# Patient Record
Sex: Female | Born: 1985
Health system: Southern US, Community
[De-identification: ages and names within clinical notes are randomized; demographics above are authoritative.]

## PROBLEM LIST (undated history)

## (undated) HISTORY — PX: HERNIA REPAIR: SHX51

---

## 2014-11-05 ENCOUNTER — Emergency Department (HOSPITAL_BASED_OUTPATIENT_CLINIC_OR_DEPARTMENT_OTHER)
Admission: EM | Admit: 2014-11-05 | Discharge: 2014-11-05 | Disposition: A | Discharge status: Home / Self Care | Payer: Self-pay | Attending: Emergency Medicine | Admitting: Emergency Medicine

## 2014-11-05 ENCOUNTER — Encounter (HOSPITAL_BASED_OUTPATIENT_CLINIC_OR_DEPARTMENT_OTHER): Payer: Self-pay | Admitting: Emergency Medicine

## 2014-11-05 DIAGNOSIS — K0889 Other specified disorders of teeth and supporting structures: Secondary | ICD-10-CM

## 2014-11-05 DIAGNOSIS — K088 Other specified disorders of teeth and supporting structures: Secondary | ICD-10-CM | POA: Insufficient documentation

## 2014-11-05 DIAGNOSIS — K029 Dental caries, unspecified: Secondary | ICD-10-CM | POA: Insufficient documentation

## 2014-11-05 MED ORDER — PENICILLIN V POTASSIUM 500 MG PO TABS: 500.0000 mg | ORAL_TABLET | Freq: Three times a day (TID) | ORAL | Status: DC

## 2014-11-05 NOTE — ED Notes (Signed)
States onset over 1 year.  One lower molar fell out(rt)  And has a lower left tooth broken in half

## 2014-11-05 NOTE — ED Provider Notes (Signed)
CSN: [id number]     Arrival date & time 11/05/14  1004 History   First MD Initiated Contact with Patient 11/05/14 1039     Chief Complaint  Patient presents with  . Dental Problem     (Consider location/radiation/quality/duration/timing/severity/associated sxs/prior Treatment) Patient is a 29 y.o. female presenting with tooth pain. The history is provided by the patient.  Dental Pain Location:  Lower Lower teeth location:  31/RL 2nd molar Quality:  Throbbing Severity:  Moderate Onset quality:  Gradual Duration:  1 year Timing:  Constant Progression:  Worsening Chronicity:  New   History reviewed. No pertinent past medical history. History reviewed. No pertinent past surgical history. No family history on file. Social History  Substance Use Topics  . Smoking status: Never Smoker   . Smokeless tobacco: None  . Alcohol Use: None   OB History    No data available     Review of Systems  All other systems reviewed and are negative.     Allergies  Mango butter  Home Medications   Prior to Admission medications   Not on File   BP 114/77 mmHg  Pulse 73  Temp(Src) 98.5 F (36.9 C) (Oral)  Resp 18  Ht  (1.6 m)  Wt 156 lb (70.761 kg)  BMI 27.64 kg/m2  SpO2 100% Physical Exam  Constitutional: She is oriented to person, place, and time. She appears well-developed and well-nourished. No distress.  HENT:  Head: Normocephalic and atraumatic.  The right lower second molar is heavily decayed with surrounding gingival inflammation. There is no obvious abscess. There is no trismus or crepitus of the submental space.  Neck: Normal range of motion. Neck supple.  Pulmonary/Chest: No stridor.  Neurological: She is alert and oriented to person, place, and time.  Skin: Skin is warm and dry. She is not diaphoretic.  Nursing note and vitals reviewed.   ED Course  Procedures (including critical care time) Labs Review Labs Reviewed - No data to display  Imaging  Review No results found. I have personally reviewed and evaluated these images and lab results as part of my medical decision-making.   EKG Interpretation None      MDM   Final diagnoses:  None    We'll treat with antibiotics and follow-up with dentistry.    Geoffery Lyons, MD 11/05/14 1044

## 2014-11-05 NOTE — Discharge Instructions (Signed)
Penicillin as prescribed.  All up with dentistry in the next 2-3 days.   Dental Pain A tooth ache may be caused by cavities (tooth decay). Cavities expose the nerve of the tooth to air and hot or cold temperatures. It may come from an infection or abscess (also called a boil or furuncle) around your tooth. It is also often caused by dental caries (tooth decay). This causes the pain you are having. DIAGNOSIS  Your caregiver can diagnose this problem by exam. TREATMENT   If caused by an infection, it may be treated with medications which kill germs (antibiotics) and pain medications as prescribed by your caregiver. Take medications as directed.  Only take over-the-counter or prescription medicines for pain, discomfort, or fever as directed by your caregiver.  Whether the tooth ache today is caused by infection or dental disease, you should see your dentist as soon as possible for further care. SEEK MEDICAL CARE IF: The exam and treatment you received today has been provided on an emergency basis only. This is not a substitute for complete medical or dental care. If your problem worsens or new problems (symptoms) appear, and you are unable to meet with your dentist, call or return to this location. SEEK IMMEDIATE MEDICAL CARE IF:   You have a fever.  You develop redness and swelling of your face, jaw, or neck.  You are unable to open your mouth.  You have severe pain uncontrolled by pain medicine. MAKE SURE YOU:   Understand these instructions.  Will watch your condition.  Will get help right away if you are not doing well or get worse. Document Released: 02/22/2005 Document Revised: 05/17/2011 Document Reviewed: 10/11/2007 [facility] Patient Information 2015 [place], [place]. This information is not intended to replace advice given to you by your health care provider. Make sure you discuss any questions you have with your health care provider.   Emergency Department Resource  Guide 1) Find a Doctor and Pay Out of Pocket Although you won't have to find out who is covered by your insurance plan, it is a good idea to ask around and get recommendations. You will then need to call the office and see if the doctor you have chosen will accept you as a new patient and what types of options they offer for patients who are self-pay. Some doctors offer discounts or will set up payment plans for their patients who do not have insurance, but you will need to ask so you aren't surprised when you get to your appointment.  2) Contact Your Local Health Department Not all health departments have doctors that can see patients for sick visits, but many do, so it is worth a call to see if yours does. If you don't know where your local health department is, you can check in your phone book. The CDC also has a tool to help you locate your state's health department, and many state websites also have listings of all of their local health departments.  3) Find a Walk-in Clinic If your illness is not likely to be very severe or complicated, you may want to try a walk in clinic. These are popping up all over the country in pharmacies, drugstores, and shopping centers. They're usually staffed by nurse practitioners or physician assistants that have been trained to treat common illnesses and complaints. They're usually fairly quick and inexpensive. However, if you have serious medical issues or chronic medical problems, these are probably not your best option.  No Primary Care  Doctor: - Call Health Connect at  [phone] - they can help you locate a primary care doctor that  accepts your insurance, provides certain services, etc. - Physician Referral Service- [phone]  Chronic Pain Problems: Organization         Address  Phone   Notes  [facility]  [phone] Patients need to be referred by their primary care doctor.   Medication Assistance: Organization          Address  Phone   Notes  [facility] Medication [facility] [address] [place] [place], [place] [address] [phone] --Must be a resident of [facility] -- Must have NO insurance coverage whatsoever (no Medicaid/ Medicare, etc.) -- The pt. MUST have a primary care doctor that directs their care regularly and follows them in the community   [facility]  [phone]   [employer]  [phone]    Agencies that provide inexpensive medical care: Organization         Address  Phone   Notes  [facility]  [phone]   [facility]    [phone]   [facility] [address] [place], [place] [address] [phone]   [facility] [address], [place] [phone]   [facility]anned [facility]    [phone]   [facility]    [phone]   [facility]  [address], [place] Phone:  [phone], Fax:  [phone] Hours of Operation:  9 am - 6 pm, M-F.  Also accepts Medicaid/Medicare and self-pay.  [facility]  [address], [place] Phone: [phone], Fax: [phone]. Hours of Operation:  8:30 am - 5:30 pm, M-F.  Also accepts Medicaid and self-pay.  [facility] [place] [address], [place] Phone: [phone]   [facility] [address] Natasha Bence [place], [place] [phone], Ext. [phone] Mondays & Thursdays: 7-9 AM.  First 15 patients are seen on a first come, first serve basis.    Medicaid-accepting [facility] Providers:  Organization         Address  Phone   Notes  [facility] [address], [place] [phone] Also accepts self-pay patients.  [facility] [address] Laurell Josephs [place], [place]  [phone]   [facility] [address], [place] [phone]   [facility] [address] [place] [phone]   Renaye Rakers [address], [place]   [phone] Only accepts [place] Access [place] patients after they have their name applied to their card.   Self-Pay (no insurance) in [facility]:  Organization         Address  Phone   Notes  Sickle Cell Patients, [facility] [address] [place], [place] [phone]   [facility] [address] [place], [place] [phone]   [facility] [place]  [address],  [phone]   [facility]/Dr. Osei-Bonsu  [address], [place] or [address], [place] [phone] Phone number for both [place] and [place] locations is the same.  [facility] [address], [place] [phone]   [facility] [address], [place] or [address] [phone]   [facility] [address], [place] [phone], phone; [phone], fax Sees patients 1st and 3rd Saturday of every month.  Must not qualify for public or private insurance (i.e. Medicaid, Medicare, [place] Health Choice, Veterans' Benefits)  Household income should be no more than 200% of the poverty level The clinic cannot treat you if you are pregnant or think you are pregnant  Sexually transmitted diseases are not treated at the clinic.    Dental Care: Organization         Address  Phone  Notes  [facility] Department of [facility] [address] [place], [place] [phone] Accepts children up to age 60 who are enrolled in [place] or [place] Health Choice; pregnant women with a Medicaid card; and children who have applied for Medicaid or [place] Health Choice, but were declined, whose parents can pay a reduced fee at time of service.  [facility] Department of [facility]  [address], [place] [phone] Accepts children up to age 33 who are enrolled in [place] or [place] Health Choice; pregnant women with a Medicaid card; and children who have applied for Medicaid or [place] Health Choice, but were declined, whose parents can pay a reduced fee at time of service.  [facility]  [address] [place], [place] [phone] Patients are seen by appointment only. Walk-ins are not accepted. [facility] will see patients 23 years of age and older. Monday - Tuesday (8am-5pm) Most Wednesdays (8:30-5pm) $30 per visit, cash only  [facility] Adult Dental Access PRO[facility]  [address], [facility] [phone] Patients are seen by appointment only. Walk-ins are not accepted. [facility] will see patients 43 years of age and older. One Wednesday Evening (Monthly: Volunteer Based).  $30 per visit, cash only  [employer]  [phone] for adults; Children under age 24, call Graduate Pediatric Dentistry at [phone]. Children aged 58-14, please call [phone] to request a pediatric application.  Dental services are provided in all areas of dental care including fillings, crowns and bridges, complete and partial dentures, implants, gum treatment, root canals, and extractions. Preventive care is also provided. Treatment is provided to both adults and children. Patients are selected via a lottery and there is often a waiting list.   [facility] [address] [place]  [phone] [email or web address]   [facility] [address] [place], [place] [phone], Ext. [phone] Second and Fourth Thursday of each month, opens at 6:30 AM; Clinic ends at 9 AM.  Patients are seen on a first-come first-served basis, and a limited number are seen during each clinic.   [facility]  [address] Ether Griffins [place], [place] [phone]   Eligibility Requirements You must  have lived in [place], [place], or [place] counties for at least the last three months.   You cannot be eligible for state or federal sponsored [place], including [employer], [place], or [employer].   You generally cannot be eligible for healthcare insurance through your employer.    How to apply: Eligibility screenings are held every Tuesday and Wednesday afternoon from 1:00 pm until 4:00 pm. You do not need an appointment for the interview!  [facility] [address] [place], [place] [phone]   Aaron Edelman  [employer] Department  [phone]   [facility] Health Department  [phone]   [facility] Health Department  [phone]    Behavioral Health Resources in the Community: Intensive Outpatient Programs Organization         Address  Phone  Notes  [facility] [place] [address] [place], [place] [phone]   [facility] [address] [place], [place] [phone]   [facility] [address] [place], [place]  [phone]   [facility] [address]  [place], [place] [phone] or [phone]   Substance Abuse Resources Organization         Address  Phone  Notes  [facility] Drug Services  [phone]   [facility]  [phone]   [facility] [place]  [phone]   Floydene Flock  [phone]   Residential & Outpatient Substance Abuse Program  [phone]   Psychological Services Organization         Address  Phone  Notes  [facility] Behavioral Health  [phone]   [facility]  [phone]   [facility] Mental Health [address] [place] [phone] or [phone]    Mobile Crisis Teams Organization         Address  Phone  Notes  [facility], Mobile Crisis Care Unit  [phone]   [facility]  [address] [place], [place] [phone]   Doristine Locks [address] [place] [phone]    Self-Help/Support Groups Organization         Address  Phone             Notes  [facility] [place] - variety of support groups  336- I7437963 Call for more information  Narcotics Anonymous (NA), [facility] [address], [employer] [place]  2 meetings at this location   Statistician         Address  Phone  Notes  [facility] [address],    [place]  [phone]   [facility]  [address] [place] [address], [place], [place] [phone]   [facility] [address] [place], [place] [phone] Admissions: 8am-3pm M-F  [facility] [address]    [place], [place] [phone]   [facility] [address] [place], [place], [place] [phone]   [facility] [address],  [place], [place] [phone]   [facility] - Intensive Outpatient [address] Laurell Joseph[address], [place], [place] [phone]   [facility] ([facility]iction [facility] [address] [place].,  [place], [place] [phone] or [phone]   [facility] ([facility]) [address], [place], [place] [phone] Accepts Medicaid  [facility] [address],  [place] [phone] Substance Abuse/Addiction Treatment   [facility] Organization         Address  Phone  Notes  [facility]  [phone]   Angie Fava, PhD [address] Ervin Knack [place], [place]   [phone] or [phone]   [facility]   [address] [place], [place] [phone]   [facility] [address] [place], [place] [phone] Insurance/Medicaid/sponsorship through [employer] and Families [address]  [place], [place] [phone] [place], [place] [phone]    Dr. Adele Schilder  [phone]   [facility] Dept. 1) [address], [place] 2) [place] 3)  [place] [address], [place] [phone]  [phone]   [place] [phone] or [phone] (After Hours)

## 2014-12-14 ENCOUNTER — Emergency Department (HOSPITAL_BASED_OUTPATIENT_CLINIC_OR_DEPARTMENT_OTHER)
Admission: EM | Admit: 2014-12-14 | Discharge: 2014-12-14 | Disposition: A | Discharge status: Home / Self Care | Payer: Self-pay | Attending: Emergency Medicine | Admitting: Emergency Medicine

## 2014-12-14 ENCOUNTER — Encounter (HOSPITAL_BASED_OUTPATIENT_CLINIC_OR_DEPARTMENT_OTHER): Payer: Self-pay | Admitting: Emergency Medicine

## 2014-12-14 ENCOUNTER — Emergency Department (HOSPITAL_BASED_OUTPATIENT_CLINIC_OR_DEPARTMENT_OTHER): Payer: Self-pay

## 2014-12-14 DIAGNOSIS — J029 Acute pharyngitis, unspecified: Secondary | ICD-10-CM | POA: Insufficient documentation

## 2014-12-14 DIAGNOSIS — Z79899 Other long term (current) drug therapy: Secondary | ICD-10-CM | POA: Insufficient documentation

## 2014-12-14 LAB — RAPID STREP SCREEN (MED CTR MEBANE ONLY): Streptococcus, Group A Screen (Direct): NEGATIVE

## 2014-12-14 MED ORDER — DEXAMETHASONE 4 MG PO TABS
10.0000 mg | ORAL_TABLET | Freq: Once | ORAL | Status: AC
  Administered 2014-12-14: 10 mg via ORAL

## 2014-12-14 MED ORDER — DEXAMETHASONE 4 MG PO TABS
ORAL_TABLET | ORAL | Status: AC
  Filled 2014-12-14: qty 3

## 2014-12-14 MED ORDER — BENZONATATE 100 MG PO CAPS: 100.0000 mg | ORAL_CAPSULE | Freq: Three times a day (TID) | ORAL | Status: DC

## 2014-12-14 NOTE — ED Notes (Signed)
Pt states has been sick "off and on" for about 2 weeks with cold like symptoms and sore throat.

## 2014-12-14 NOTE — Discharge Instructions (Signed)

## 2014-12-14 NOTE — ED Provider Notes (Signed)
CSN: [id number]     Arrival date & time 12/14/14  4098 History   First MD Initiated Contact with Patient 12/14/14 0820     Chief Complaint  Patient presents with  . Sore Throat     (Consider location/radiation/quality/duration/timing/severity/associated sxs/prior Treatment) Patient is a 29 y.o. female presenting with cough.  Cough Cough characteristics:  Non-productive Severity:  Moderate Onset quality:  Gradual Duration:  2 weeks Timing:  Intermittent Progression:  Unchanged Chronicity:  New Smoker: no   Context: upper respiratory infection   Relieved by:  Nothing Worsened by:  Nothing tried Associated symptoms: chills, rhinorrhea and sinus congestion   Associated symptoms: no fever, no rash and no shortness of breath     History reviewed. No pertinent past medical history. History reviewed. No pertinent past surgical history. No family history on file. Social History  Substance Use Topics  . Smoking status: Never Smoker   . Smokeless tobacco: None  . Alcohol Use: No   OB History    No data available     Review of Systems  Constitutional: Positive for chills. Negative for fever.  HENT: Positive for rhinorrhea.   Respiratory: Positive for cough. Negative for shortness of breath.   Skin: Negative for rash.  All other systems reviewed and are negative.     Allergies  Mango butter  Home Medications   Prior to Admission medications   Medication Sig Start Date End Date Taking? Authorizing Provider  benzonatate (TESSALON) 100 MG capsule Take 1 capsule (100 mg total) by mouth every 8 (eight) hours. 12/14/14   Mirian Mo, MD  penicillin v potassium (VEETID) 500 MG tablet Take 1 tablet (500 mg total) by mouth 3 (three) times daily. 11/05/14   Geoffery Lyons, MD   BP 113/72 mmHg  Pulse 96  Temp(Src) 98.5 F (36.9 C) (Oral)  Resp 18  Ht  (1.6 m)  Wt 156 lb (70.761 kg)  BMI 27.64 kg/m2  SpO2 99%  LMP 11/14/2014 (Approximate) Physical Exam   Constitutional: She is oriented to person, place, and time. She appears well-developed and well-nourished.  HENT:  Head: Normocephalic and atraumatic.  Right Ear: External ear normal.  Left Ear: External ear normal.  Oropharynx clear  Eyes: Conjunctivae and EOM are normal. Pupils are equal, round, and reactive to light.  Neck: Normal range of motion. Neck supple.  Cardiovascular: Normal rate, regular rhythm, normal heart sounds and intact distal pulses.   Pulmonary/Chest: Effort normal and breath sounds normal.  Abdominal: Soft. Bowel sounds are normal. There is no tenderness.  Musculoskeletal: Normal range of motion.  Neurological: She is alert and oriented to person, place, and time.  Skin: Skin is warm and dry.  Vitals reviewed.   ED Course  Procedures (including critical care time) Labs Review Labs Reviewed  RAPID STREP SCREEN (NOT AT [facility])  CULTURE, GROUP A STREP    Imaging Review Dg Chest 2 View  12/14/2014   CLINICAL DATA:  Cough and congestion.  EXAM: CHEST - 2 VIEW  COMPARISON:  None  FINDINGS: The heart size and mediastinal contours are within normal limits. Mild bronchial thickening present in both lower lung zones. There is no evidence of pulmonary edema, pneumothorax, nodule or pleural fluid. The visualized skeletal structures are unremarkable.  IMPRESSION: Bronchial thickening which may be reflective of active bronchitis. No focal infiltrate identified.   Electronically Signed   By: Irish Lack M.D.   On: 12/14/2014 09:01   I have personally reviewed and evaluated these images and  lab results as part of my medical decision-making.   EKG Interpretation None      MDM   Final diagnoses:  Pharyngitis    29 y.o. female without pertinent PMH presents with signs/symptoms of URI.  Exam benign, oropharynx clear.  Decadron given for sore throat.  DC home in stable condition.    I have reviewed all laboratory and imaging studies if ordered as above  1.  Pharyngitis         Mirian Mo, MD 12/14/14 [phone]

## 2014-12-16 LAB — CULTURE, GROUP A STREP

## 2015-05-25 ENCOUNTER — Encounter (HOSPITAL_BASED_OUTPATIENT_CLINIC_OR_DEPARTMENT_OTHER): Payer: Self-pay

## 2015-05-25 ENCOUNTER — Emergency Department (HOSPITAL_BASED_OUTPATIENT_CLINIC_OR_DEPARTMENT_OTHER)
Admission: EM | Admit: 2015-05-25 | Discharge: 2015-05-25 | Disposition: A | Discharge status: Home / Self Care | Payer: Self-pay | Attending: Emergency Medicine | Admitting: Emergency Medicine

## 2015-05-25 DIAGNOSIS — R110 Nausea: Secondary | ICD-10-CM | POA: Insufficient documentation

## 2015-05-25 DIAGNOSIS — Z792 Long term (current) use of antibiotics: Secondary | ICD-10-CM | POA: Insufficient documentation

## 2015-05-25 DIAGNOSIS — J069 Acute upper respiratory infection, unspecified: Secondary | ICD-10-CM | POA: Insufficient documentation

## 2015-05-25 MED ORDER — ONDANSETRON 4 MG PO TBDP: 4.0000 mg | ORAL_TABLET | Freq: Three times a day (TID) | ORAL | Status: DC | PRN

## 2015-05-25 MED ORDER — IBUPROFEN 800 MG PO TABS: 800.0000 mg | ORAL_TABLET | Freq: Three times a day (TID) | ORAL | Status: DC

## 2015-05-25 MED ORDER — BENZONATATE 100 MG PO CAPS: 100.0000 mg | ORAL_CAPSULE | Freq: Three times a day (TID) | ORAL | Status: DC

## 2015-05-25 NOTE — ED Notes (Signed)
Pt reports cough, cold, congestion and runny nose since Wednesday. Reports "taking everything" but not feeling any better.

## 2015-05-25 NOTE — ED Provider Notes (Signed)
CSN: [id number]     Arrival date & time 05/25/15  [id number] History   First MD Initiated Contact with Patient 05/25/15 [phone]     Chief Complaint  Patient presents with  . URI     (Consider location/radiation/quality/duration/timing/severity/associated sxs/prior Treatment) HPI   Erica Andersen is a 30 y.o. female, patient with no pertinent past medical history, presenting to the ED with cough, nasal congestion, and rhinorrhea for the last four days. Endorses some minor nausea. Pt has tried Theraflu, Delsym, and Mucinex, all with minimal relief. Pt denies fever/chills, vomiting/diarrhea, chest pain, shortness of breath, abdominal pain, or any other complaints.   History reviewed. No pertinent past medical history. History reviewed. No pertinent past surgical history. No family history on file. Social History  Substance Use Topics  . Smoking status: Never Smoker   . Smokeless tobacco: None  . Alcohol Use: No   OB History    No data available     Review of Systems  Constitutional: Negative for fever, chills and diaphoresis.  HENT: Positive for congestion and rhinorrhea.   Respiratory: Positive for cough.   Cardiovascular: Negative for chest pain.  Gastrointestinal: Positive for nausea. Negative for vomiting, abdominal pain and diarrhea.  Skin: Negative for color change and pallor.  Neurological: Negative for dizziness, light-headedness and headaches.  All other systems reviewed and are negative.     Allergies  Mango butter  Home Medications   Prior to Admission medications   Medication Sig Start Date End Date Taking? Authorizing Provider  benzonatate (TESSALON) 100 MG capsule Take 1 capsule (100 mg total) by mouth every 8 (eight) hours. 12/14/14   Mirian MoMatthew Gentry, MD  benzonatate (TESSALON) 100 MG capsule Take 1 capsule (100 mg total) by mouth every 8 (eight) hours. 05/25/15   Shawn C Joy, PA-C  ibuprofen (ADVIL,MOTRIN) 800 MG tablet Take 1 tablet (800 mg total) by mouth 3 (three)  times daily. 05/25/15   Shawn C Joy, PA-C  ondansetron (ZOFRAN ODT) 4 MG disintegrating tablet Take 1 tablet (4 mg total) by mouth every 8 (eight) hours as needed for nausea or vomiting. 05/25/15   Shawn C Joy, PA-C  penicillin v potassium (VEETID) 500 MG tablet Take 1 tablet (500 mg total) by mouth 3 (three) times daily. 11/05/14   Geoffery Lyonsouglas Delo, MD   BP 115/94 mmHg  Pulse 104  Temp(Src) 98 F (36.7 C) (Oral)  Resp 18  Ht 5\' 3"  (1.6 m)  Wt 71.215 kg  BMI 27.82 kg/m2  LMP 04/24/2015 Physical Exam  Constitutional: She appears well-developed and well-nourished. No distress.  HENT:  Head: Normocephalic and atraumatic.  Eyes: Conjunctivae are normal. Pupils are equal, round, and reactive to light.  Neck: Neck supple.  Cardiovascular: Normal rate, regular rhythm, normal heart sounds and intact distal pulses.   Pulmonary/Chest: Effort normal and breath sounds normal. No respiratory distress.  Abdominal: Soft. Bowel sounds are normal. There is no tenderness. There is no guarding.  Musculoskeletal: She exhibits no edema or tenderness.  Lymphadenopathy:    She has no cervical adenopathy.  Neurological: She is alert.  Skin: Skin is warm and dry. She is not diaphoretic.  Psychiatric: She has a normal mood and affect. Her behavior is normal.  Nursing note and vitals reviewed.   ED Course  Procedures (including critical care time)   MDM   Final diagnoses:  Upper respiratory infection    Mark Makela presents with cough, nasal congestion, and rhinorrhea for the past 4 days.  Patient's presentation is consistent  with a viral illness. Patient was not tachycardic upon my exam. No signs of sepsis. Symptomatic care and return precautions discussed. Patient voiced understanding of these instructions and is comfortable with discharge.    Anselm Pancoast, PA-C 05/25/15 0930  Pricilla Loveless, MD 05/25/15 [phone]

## 2015-05-25 NOTE — ED Notes (Signed)
PA at bedside.

## 2015-05-25 NOTE — Discharge Instructions (Signed)
You have been seen today for a cough. Your symptoms are consistent with a viral illness. Viruses do not require antibiotics. Treatment is symptomatic care. Drink plenty of fluids and get plenty of rest. You should be drinking at least a liter of water an hour to stay hydrated. Ibuprofen or Tylenol for pain or fever. Zofran for nausea. Tessalon for cough. Plain Mucinex may help relieve congestion. Warm liquids or Chloraseptic spray may help soothe the sore throat. Follow up with PCP as needed. Return to ED should symptoms worsen.  RESOURCE GUIDE  Chronic Pain Problems: Contact [facility]  [phone] Patients need to be referred by their primary care doctor.  Insufficient Money for Medicine: Contact United Way:  call "211" or [facility] [phone].  No Primary Care Doctor: - Call Health Connect  [phone] - can help you locate a primary care doctor that  accepts your insurance, provides certain services, etc. - Physician Referral Service- [phone]  Agencies that provide inexpensive medical care: - [facility]  [phone] - [facility]  [phone] - [facility]  [phone] - Women's Clinic  [phone] - Planned Parenthood  [phone] Haynes Bast- [facility]  [phone]  Medicaid-accepting [facility] Providers: - [facility]- [address], [address]  [phone], Mon-Fri 9am-7pm, Sat 9am-1pm - [facility]- [address], [address] [place]  [phone] - [facility]- [address], [address] [place]  [phone] [facility]- [facility]- [address] [place] [address]  [phone] - Renaye RakersVeita Bland- [address], [address], [phone]  Only accepts [place] patients after they have their name  applied to their card  Self Pay (no insurance) in [place]: - Sickle Cell Patients: Dr Willey BladeEric Dean, [facility]  [address],  [phone] - [facility]- [address]  [phone]       Redge Gainer-     [facility] [place]- [address], [address]       -     [facility]- see information above (Speak to CitigroupPam H if you do not have insurance)       -  [facility]- [address], [phone]       -  [facility]- [address],  [phone]       -  [facility]- [address] [place] [address], [phone]       -  Dr Julio Sickssei-Bonsu-  [address], [address], [place], [phone]       -  [facility]- [address], [phone]       -  [facility]- [address] [place] [address], [phone], also [address], [phone]       -    [facility]- [address], [phone], 1st & 3rd Saturday   every month, 10am-1pm  1) Find a Doctor and Pay Out of Pocket Although you won't have to find out who is covered by your insurance plan, it is a good idea to ask around and get recommendations. You will then need to call the office and see if the doctor you have chosen will accept you as a new patient and what types of options they offer for patients who are self-pay. Some doctors offer discounts or will set up payment plans for their patients  who do not have insurance, but you will need to ask so you aren't surprised when you get to your appointment.  2) Contact Your Local Health Department Not all health departments have doctors that can see patients for sick visits, but many do, so it is worth a call to see if yours does. If you don't know where your local health department is, you can check in your phone book. The CDC also has a tool to help you locate your state's health department, and many state websites also have listings of all of their local health departments.  3) Find a Walk-in Clinic If your illness is not likely to be very severe or complicated, you may want to try a walk in clinic. These are popping up all over the country in pharmacies,  drugstores, and shopping centers. They're usually staffed by nurse practitioners or physician assistants that have been trained to treat common illnesses and complaints. They're usually fairly quick and inexpensive. However, if you have serious medical issues or chronic medical problems, these are probably not your best option  STD Testing - [facility] of [facility] [place], STD Clinic, [address] [place], phone [phone] or [phone].  Monday - Friday, call for an appointment. [facility] of [employer], STD Clinic, [address], [place], phone [phone] or [phone].  Monday - Friday, call for an appointment.  Abuse/Neglect: [facility] Child Abuse Hotline 845-211-5865 [facility] Child Abuse Hotline (219) 792-4677 (After Hours)  Emergency Shelter:  [facility] [phone]  Maternity Homes: - [facility] [place] [facility] [phone] - [facility] [phone]  MRSA Hotline #:   343-773-1376  [facility] Resources  [facility] [place]  United [facility] Dept. [address]                 [address]         [address]  Blondell Reveal Phone:  [phone]                                  Phone:  [phone]                   Phone:  [phone]  [facility], [phone] - [facility] - [phone]       -     [facility] in [place], [address]                                  [phone], Insurance  [place] Child Abuse Hotline (315) 100-0520 or 984-593-8977 (After Hours)   Behavioral Health Services  Substance Abuse Resources: - Alcohol and Drug Services  223-185-4921 - [facility]  [phone]) [phone] - [facility]  [place] - Residential & Outpatient Substance Abuse Program  [phone]  Psychological Services: - [place], [place] [address], [place], [place]: [phone] or [phone], PicCapture.uy  Dental Assistance  If unable to pay or uninsured, contact:  [facility] or [facility]. to become qualified for the adult dental clinic.  Patients with Medicaid: [facility] [id number], [place] [address], [phone]  If unable to pay, or uninsured, contact [facility] [phone]) or [place] [phone] in [place], [place] in [facility]) to become qualified for the adult dental clinic   Other [place]- [place], [place], [place], [address], [place], [place], 2nd and 4th Thursday of the month at 6:30am.  10 clients each day by appointment, can sometimes see walk-in patients if someone does not show for an appointment. [facility]- [address] [place]ard Danker [place], [place], [address], [place], [place], [place], [address], [place] Department- [place] Department- [employer] Department- [phone]

## 2015-07-23 ENCOUNTER — Encounter (HOSPITAL_BASED_OUTPATIENT_CLINIC_OR_DEPARTMENT_OTHER): Payer: Self-pay | Admitting: *Deleted

## 2015-07-23 ENCOUNTER — Emergency Department (HOSPITAL_BASED_OUTPATIENT_CLINIC_OR_DEPARTMENT_OTHER)
Admission: EM | Admit: 2015-07-23 | Discharge: 2015-07-23 | Disposition: A | Discharge status: Home / Self Care | Payer: Self-pay | Attending: Emergency Medicine | Admitting: Emergency Medicine

## 2015-07-23 DIAGNOSIS — N912 Amenorrhea, unspecified: Secondary | ICD-10-CM | POA: Insufficient documentation

## 2015-07-23 LAB — URINALYSIS, ROUTINE W REFLEX MICROSCOPIC
Bilirubin Urine: NEGATIVE
GLUCOSE, UA: NEGATIVE mg/dL
Hgb urine dipstick: NEGATIVE
Ketones, ur: NEGATIVE mg/dL
LEUKOCYTES UA: NEGATIVE
Nitrite: NEGATIVE
PROTEIN: NEGATIVE mg/dL
Specific Gravity, Urine: 1.013 (ref 1.005–1.030)
pH: 6 (ref 5.0–8.0)

## 2015-07-23 LAB — PREGNANCY, URINE: Preg Test, Ur: NEGATIVE

## 2015-07-23 NOTE — Discharge Instructions (Signed)
Repeat a pregnancy test at home in 1 week.  Follow up with gyn if you do not start your period

## 2015-07-23 NOTE — ED Notes (Signed)
Pt states she does not remember having a period in April. Has been feeling nauseated and breasts are tender.

## 2015-07-23 NOTE — ED Notes (Signed)
Pt given d/c instructions as per chart. Verbalizes understanding. No questions. 

## 2015-07-23 NOTE — ED Notes (Signed)
PA at bedside.

## 2015-07-23 NOTE — ED Provider Notes (Signed)
CSN: [id number]     Arrival date & time 07/23/15  4098 History   First MD Initiated Contact with Patient 07/23/15 [phone]     Chief Complaint  Patient presents with  . Possible Pregnancy     (Consider location/radiation/quality/duration/timing/severity/associated sxs/prior Treatment) Patient is a 30 y.o. female presenting with pregnancy problem. The history is provided by the patient. No language interpreter was used.  Possible Pregnancy This is a new problem. The current episode started 1 to 4 weeks ago. The problem occurs constantly. The problem has been unchanged. Pertinent negatives include no abdominal pain. Nothing aggravates the symptoms. She has tried nothing for the symptoms. The treatment provided no relief.  Pt rports increased appetite, breast tenderness and no period.  Pt reports she is 2 weeks late.    History reviewed. No pertinent past medical history. History reviewed. No pertinent past surgical history. History reviewed. No pertinent family history. Social History  Substance Use Topics  . Smoking status: Never Smoker   . Smokeless tobacco: None  . Alcohol Use: No   OB History    Gravida Para Term Preterm AB TAB SAB Ectopic Multiple Living   1 0             Review of Systems  Gastrointestinal: Negative for abdominal pain.  All other systems reviewed and are negative.     Allergies  Mango butter  Home Medications   Prior to Admission medications   Medication Sig Start Date End Date Taking? Authorizing Provider  benzonatate (TESSALON) 100 MG capsule Take 1 capsule (100 mg total) by mouth every 8 (eight) hours. 12/14/14   Mirian Mo, MD  benzonatate (TESSALON) 100 MG capsule Take 1 capsule (100 mg total) by mouth every 8 (eight) hours. 05/25/15   Shawn C Joy, PA-C  ibuprofen (ADVIL,MOTRIN) 800 MG tablet Take 1 tablet (800 mg total) by mouth 3 (three) times daily. 05/25/15   Shawn C Joy, PA-C  ondansetron (ZOFRAN ODT) 4 MG disintegrating tablet Take 1 tablet  (4 mg total) by mouth every 8 (eight) hours as needed for nausea or vomiting. 05/25/15   Shawn C Joy, PA-C  penicillin v potassium (VEETID) 500 MG tablet Take 1 tablet (500 mg total) by mouth 3 (three) times daily. 11/05/14   Geoffery Lyons, MD   BP 130/97 mmHg  Pulse 76  Temp(Src) 98.8 F (37.1 C) (Oral)  Resp 20  Ht  (1.6 m)  Wt 71.215 kg  BMI 27.82 kg/m2  SpO2 100%  LMP 06/13/2015 Physical Exam  Constitutional: She is oriented to person, place, and time. She appears well-developed and well-nourished.  HENT:  Head: Normocephalic.  Right Ear: External ear normal.  Left Ear: External ear normal.  Nose: Nose normal.  Mouth/Throat: Oropharynx is clear and moist.  Eyes: EOM are normal. Pupils are equal, round, and reactive to light.  Neck: Normal range of motion.  Cardiovascular: Normal rate and normal heart sounds.   Pulmonary/Chest: Effort normal.  Abdominal: She exhibits no distension.  Musculoskeletal: Normal range of motion.  Neurological: She is alert and oriented to person, place, and time.  Psychiatric: She has a normal mood and affect.  Nursing note and vitals reviewed.   ED Course  Procedures (including critical care time) Labs Review Labs Reviewed  URINALYSIS, ROUTINE W REFLEX MICROSCOPIC (NOT AT [facility])  PREGNANCY, URINE    Imaging Review No results found. I have personally reviewed and evaluated these images and lab results as part of my medical decision-making.   EKG  Interpretation None      MDM no discharge no std concerns.   Pt advised to repeat a pregnancy test in 1 week.  Schedule with gyn if test is negative and you do not start your period   Final diagnoses:  Amenorrhea    An After Visit Summary was printed and given to the patient.    Lonia SkinnerLeslie K Forest OaksSofia, PA-C 07/23/15 1016  Marily MemosJason Mesner, MD 07/23/15 [phone]

## 2015-12-04 ENCOUNTER — Emergency Department (HOSPITAL_BASED_OUTPATIENT_CLINIC_OR_DEPARTMENT_OTHER)
Admission: EM | Admit: 2015-12-04 | Discharge: 2015-12-04 | Disposition: A | Discharge status: Home / Self Care | Payer: Self-pay | Attending: Emergency Medicine | Admitting: Emergency Medicine

## 2015-12-04 ENCOUNTER — Encounter (HOSPITAL_BASED_OUTPATIENT_CLINIC_OR_DEPARTMENT_OTHER): Payer: Self-pay | Admitting: Emergency Medicine

## 2015-12-04 DIAGNOSIS — N915 Oligomenorrhea, unspecified: Secondary | ICD-10-CM | POA: Insufficient documentation

## 2015-12-04 LAB — URINALYSIS, ROUTINE W REFLEX MICROSCOPIC
BILIRUBIN URINE: NEGATIVE
GLUCOSE, UA: NEGATIVE mg/dL
KETONES UR: NEGATIVE mg/dL
LEUKOCYTES UA: NEGATIVE
Nitrite: NEGATIVE
PH: 7.5 (ref 5.0–8.0)
PROTEIN: NEGATIVE mg/dL
Specific Gravity, Urine: 1.009 (ref 1.005–1.030)

## 2015-12-04 LAB — URINE MICROSCOPIC-ADD ON

## 2015-12-04 LAB — PREGNANCY, URINE: Preg Test, Ur: NEGATIVE

## 2015-12-04 NOTE — Discharge Instructions (Signed)
You have been seen today for multiple periods in the same month. Your lab tests showed no abnormalities. There were no signs of severe blood loss on your vital signs. Follow up with OB/GYN as soon as possible. You may take ibuprofen or naproxen for pain or discomfort.

## 2015-12-04 NOTE — ED Triage Notes (Addendum)
Pt having irregular periods since July.  Pt having two periods in July and three periods this month.  One period this month was heavier than normal.

## 2015-12-04 NOTE — ED Provider Notes (Signed)
[facility] Provider Note   CSN: [id number] Arrival date & time: 12/04/15  0946     History   Chief Complaint Chief Complaint  Patient presents with  . Dysmenorrhea    HPI Erica Andersen is a 30 y.o. female.  HPI   Erica Andersen is a 30 y.o. female, Patient with no pertinent past medical history, presenting to the ED with multiple periods this month. Pt states she is having her third period since the beginning of September. Has been using about 2-3 pads a day, which is the normal level for one of her periods. Pt had two periods in July. Normal period in August. Endorses intermittent abdominal cramping consistent with her periods. Denies N/V/C/D, fever/chills, current abdominal pain, dizziness, chest pain, shortness of breath, or any other complaints.     History reviewed. No pertinent past medical history.  There are no active problems to display for this patient.   History reviewed. No pertinent surgical history.  OB History    Gravida Para Term Preterm AB Living   1 0           SAB TAB Ectopic Multiple Live Births                   Home Medications    Prior to Admission medications   Not on File    Family History No family history on file.  Social History Social History  Substance Use Topics  . Smoking status: Never Smoker  . Smokeless tobacco: Never Used  . Alcohol use No     Allergies   Mango butter   Review of Systems Review of Systems  Constitutional: Negative for chills and fever.  Respiratory: Negative for shortness of breath.   Cardiovascular: Negative for chest pain.  Gastrointestinal: Negative for abdominal pain, nausea and vomiting.  Genitourinary: Positive for menstrual problem.  Musculoskeletal: Negative for back pain.  Neurological: Negative for dizziness, weakness, light-headedness and headaches.  All other systems reviewed and are negative.    Physical Exam Updated Vital Signs BP 118/70 (BP Location: Right Arm)    Pulse 74   Temp 98 F (36.7 C) (Oral)   Resp 18   Ht 5\' 3"  (1.6 m)   Wt 75.8 kg   LMP 12/04/2015   SpO2 100%   BMI 29.58 kg/m   Physical Exam  Constitutional: She appears well-developed and well-nourished. No distress.  HENT:  Head: Normocephalic and atraumatic.  Eyes: Conjunctivae are normal.  Neck: Neck supple.  Cardiovascular: Normal rate, regular rhythm, normal heart sounds and intact distal pulses.   Pulmonary/Chest: Effort normal and breath sounds normal. No respiratory distress.  Abdominal: Soft. There is no tenderness. There is no guarding.  Musculoskeletal: She exhibits no edema or tenderness.  Lymphadenopathy:    She has no cervical adenopathy.  Neurological: She is alert.  Skin: Skin is warm and dry. She is not diaphoretic.  Psychiatric: She has a normal mood and affect. Her behavior is normal.  Nursing note and vitals reviewed.    ED Treatments / Results  Labs (all labs ordered are listed, but only abnormal results are displayed) Labs Reviewed  URINALYSIS, ROUTINE W REFLEX MICROSCOPIC (NOT AT [facility]) - Abnormal; Notable for the following:       Result Value   Hgb urine dipstick SMALL (*)    All other components within normal limits  URINE MICROSCOPIC-ADD ON - Abnormal; Notable for the following:    Squamous Epithelial / LPF 0-5 (*)    Bacteria,  UA RARE (*)    All other components within normal limits  PREGNANCY, URINE    EKG  EKG Interpretation None       Radiology No results found.  Procedures Procedures (including critical care time)  Medications Ordered in ED Medications - No data to display   Initial Impression / Assessment and Plan / ED Course  I have reviewed the triage vital signs and the nursing notes.  Pertinent labs & imaging results that were available during my care of the patient were reviewed by me and considered in my medical decision making (see chart for details).  Clinical Course    Patient presents with oligomenorrhea.  Vital signs and patient presentation do not suggest concern for symptomatic anemia. Patient to follow-up with OB/GYN. Resources given. Return precautions discussed. Patient is comfortable with discharge.  Vitals:   12/04/15 1036 12/04/15 1201  BP: 118/70 108/67  Pulse: 74 69  Resp: 18 18  Temp: 98 F (36.7 C)   TempSrc: Oral   SpO2: 100% 95%  Weight: 75.8 kg   Height: 5\' 3"  (1.6 m)       Final Clinical Impressions(s) / ED Diagnoses   Final diagnoses:  Oligomenorrhea    New Prescriptions There are no discharge medications for this patient.    Anselm Pancoast, PA-C 12/05/15 1610    Vanetta Mulders, MD 12/06/15 [phone]

## 2016-08-04 ENCOUNTER — Encounter (HOSPITAL_BASED_OUTPATIENT_CLINIC_OR_DEPARTMENT_OTHER): Payer: Self-pay | Admitting: *Deleted

## 2016-08-04 ENCOUNTER — Emergency Department (HOSPITAL_BASED_OUTPATIENT_CLINIC_OR_DEPARTMENT_OTHER)
Admission: EM | Admit: 2016-08-04 | Discharge: 2016-08-04 | Disposition: A | Discharge status: Home / Self Care | Payer: Self-pay | Attending: Emergency Medicine | Admitting: Emergency Medicine

## 2016-08-04 DIAGNOSIS — J019 Acute sinusitis, unspecified: Secondary | ICD-10-CM | POA: Insufficient documentation

## 2016-08-04 MED ORDER — FLUTICASONE PROPIONATE 50 MCG/ACT NA SUSP: 1.0000 | Freq: Every day | NASAL | 1 refills | Status: AC

## 2016-08-04 MED ORDER — HYDROCODONE-HOMATROPINE 5-1.5 MG/5ML PO SYRP: 5.0000 mL | ORAL_SOLUTION | Freq: Four times a day (QID) | ORAL | 0 refills | Status: AC | PRN

## 2016-08-04 MED ORDER — AMOXICILLIN-POT CLAVULANATE 875-125 MG PO TABS: 1.0000 | ORAL_TABLET | Freq: Two times a day (BID) | ORAL | 0 refills | Status: AC

## 2016-08-04 NOTE — ED Provider Notes (Signed)
[facility]   CSN: [id number] Arrival date & time: 08/04/16  1716     History   Chief Complaint Chief Complaint  Patient presents with  . URI    HPI Erica Andersen is a 31 y.o. female.  The history is provided by the patient.  URI   This is a new problem. Episode onset: 2 weeks. The problem has not changed since onset.Maximum temperature: intermittent subjective fevers and chills. Associated symptoms include congestion, headaches, plugged ear sensation, rhinorrhea, sinus pain, sore throat and cough. Pertinent negatives include no chest pain, no abdominal pain, no diarrhea, no nausea, no vomiting and no wheezing. Associated symptoms comments: No SOB.  . She has tried other medications (she has tried many otc meds without improvement) for the symptoms. The treatment provided no relief.    History reviewed. No pertinent past medical history.  There are no active problems to display for this patient.   Past Surgical History:  Procedure Laterality Date  . HERNIA REPAIR      OB History    Gravida Para Term Preterm AB Living   1 0           SAB TAB Ectopic Multiple Live Births                   Home Medications    Prior to Admission medications   Medication Sig Start Date End Date Taking? Authorizing Provider  amoxicillin-clavulanate (AUGMENTIN) 875-125 MG tablet Take 1 tablet by mouth every 12 (twelve) hours. 08/04/16   Gwyneth Sprout, MD  fluticasone (FLONASE) 50 MCG/ACT nasal spray Place 1 spray into both nostrils daily. 08/04/16   Gwyneth Sprout, MD  HYDROcodone-homatropine (HYCODAN) 5-1.5 MG/5ML syrup Take 5 mLs by mouth every 6 (six) hours as needed for cough. 08/04/16   Gwyneth Sprout, MD    Family History History reviewed. No pertinent family history.  Social History Social History  Substance Use Topics  . Smoking status: Never Smoker  . Smokeless tobacco: Never Used  . Alcohol use No     Allergies   Mango  butter   Review of Systems Review of Systems  HENT: Positive for congestion, rhinorrhea, sinus pain and sore throat.   Respiratory: Positive for cough. Negative for wheezing.   Cardiovascular: Negative for chest pain.  Gastrointestinal: Negative for abdominal pain, diarrhea, nausea and vomiting.  Neurological: Positive for headaches.  All other systems reviewed and are negative.    Physical Exam Updated Vital Signs BP 118/87 (BP Location: Left Arm)   Pulse 86   Temp 99 F (37.2 C)   Resp 16   Ht 5\' 4"  (1.626 m)   Wt 77.1 kg (170 lb)   LMP 07/14/2016   SpO2 100%   BMI 29.18 kg/m   Physical Exam  Constitutional: She is oriented to person, place, and time. She appears well-developed and well-nourished. No distress.  HENT:  Head: Normocephalic and atraumatic.  Right Ear: A middle ear effusion is present.  Left Ear: A middle ear effusion is present.  Nose: Mucosal edema and rhinorrhea present. Right sinus exhibits maxillary sinus tenderness and frontal sinus tenderness. Left sinus exhibits maxillary sinus tenderness and frontal sinus tenderness.  Mouth/Throat: Posterior oropharyngeal erythema present. No oropharyngeal exudate or posterior oropharyngeal edema.  Eyes: Conjunctivae and EOM are normal. Pupils are equal, round, and reactive to light.  Neck: Normal range of motion. Neck supple.  Cardiovascular: Normal rate, regular rhythm and intact distal pulses.   No murmur heard. Pulmonary/Chest:  Effort normal and breath sounds normal. No respiratory distress. She has no wheezes. She has no rales.  Abdominal: Soft. She exhibits no distension. There is no tenderness. There is no rebound and no guarding.  Musculoskeletal: Normal range of motion. She exhibits no edema or tenderness.  Neurological: She is alert and oriented to person, place, and time.  Skin: Skin is warm and dry. No rash noted. No erythema.  Psychiatric: She has a normal mood and affect. Her behavior is normal.   Nursing note and vitals reviewed.    ED Treatments / Results  Labs (all labs ordered are listed, but only abnormal results are displayed) Labs Reviewed - No data to display  EKG  EKG Interpretation None       Radiology No results found.  Procedures Procedures (including critical care time)  Medications Ordered in ED Medications - No data to display   Initial Impression / Assessment and Plan / ED Course  I have reviewed the triage vital signs and the nursing notes.  Pertinent labs & imaging results that were available during my care of the patient were reviewed by me and considered in my medical decision making (see chart for details).     Pt with symptoms consistent with subacute sinusitis and URI.  Well appearing here.  No signs of breathing difficulty  No signs of pharyngitis, otitis or abnormal abdominal findings.  Swollen nasal turbinates, sinus pain and temp of 99.  Will treat with nasal spray and augmentin.    pt to return with any further problems.   Final Clinical Impressions(s) / ED Diagnoses   Final diagnoses:  Subacute sinusitis, unspecified location    New Prescriptions New Prescriptions   AMOXICILLIN-CLAVULANATE (AUGMENTIN) 875-125 MG TABLET    Take 1 tablet by mouth every 12 (twelve) hours.   FLUTICASONE (FLONASE) 50 MCG/ACT NASAL SPRAY    Place 1 spray into both nostrils daily.   HYDROCODONE-HOMATROPINE (HYCODAN) 5-1.5 MG/5ML SYRUP    Take 5 mLs by mouth every 6 (six) hours as needed for cough.     Gwyneth SproutPlunkett, Sesar Madewell, MD 08/04/16 [phone]

## 2016-08-04 NOTE — ED Triage Notes (Signed)
Pt c/o URI symtpoms x 2 weeks  

## 2016-12-14 ENCOUNTER — Emergency Department (HOSPITAL_BASED_OUTPATIENT_CLINIC_OR_DEPARTMENT_OTHER)
Admission: EM | Admit: 2016-12-14 | Discharge: 2016-12-14 | Disposition: A | Discharge status: Home / Self Care | Payer: BLUE CROSS/BLUE SHIELD | Attending: Emergency Medicine | Admitting: Emergency Medicine

## 2016-12-14 ENCOUNTER — Encounter (HOSPITAL_BASED_OUTPATIENT_CLINIC_OR_DEPARTMENT_OTHER): Payer: Self-pay | Admitting: Emergency Medicine

## 2016-12-14 DIAGNOSIS — M791 Myalgia, unspecified site: Secondary | ICD-10-CM | POA: Insufficient documentation

## 2016-12-14 DIAGNOSIS — R0981 Nasal congestion: Secondary | ICD-10-CM | POA: Diagnosis not present

## 2016-12-14 DIAGNOSIS — B9789 Other viral agents as the cause of diseases classified elsewhere: Secondary | ICD-10-CM | POA: Diagnosis not present

## 2016-12-14 DIAGNOSIS — R05 Cough: Secondary | ICD-10-CM | POA: Insufficient documentation

## 2016-12-14 DIAGNOSIS — Z79899 Other long term (current) drug therapy: Secondary | ICD-10-CM | POA: Insufficient documentation

## 2016-12-14 DIAGNOSIS — J069 Acute upper respiratory infection, unspecified: Secondary | ICD-10-CM

## 2016-12-14 DIAGNOSIS — R070 Pain in throat: Secondary | ICD-10-CM | POA: Diagnosis present

## 2016-12-14 LAB — RAPID STREP SCREEN (MED CTR MEBANE ONLY): Streptococcus, Group A Screen (Direct): NEGATIVE

## 2016-12-14 MED ORDER — PSEUDOEPHEDRINE HCL 30 MG PO TABS: 30.0000 mg | ORAL_TABLET | ORAL | 0 refills | Status: AC | PRN

## 2016-12-14 MED ORDER — BENZONATATE 100 MG PO CAPS: 100.0000 mg | ORAL_CAPSULE | Freq: Three times a day (TID) | ORAL | 0 refills | Status: AC

## 2016-12-14 MED FILL — SUDOGEST 30 MG TABLET: 30 | 5 days supply | Qty: 30 | Fill #0

## 2016-12-14 MED FILL — BENZONATATE 100 MG CAPSULE: 100 | 7 days supply | Qty: 21 | Fill #0

## 2016-12-14 NOTE — Discharge Instructions (Signed)
Take ibuprofen or tylenol for pain and fever. Drink plenty of fluids. Sudafed for congestion. Tessalon for cough. Follow up with family doctor if not improving, return if worsening.

## 2016-12-14 NOTE — ED Provider Notes (Signed)
[facility]   CSN: [id number] Arrival date & time: 12/14/16  1301     History   Chief Complaint Chief Complaint  Patient presents with  . Sore Throat    HPI Erica Andersen is a 31 y.o. female.  HPI Erica Andersen is a 31 y.o. female presents to ED with complaint of sore throat, congestion, ear pain, cough, body aches, fever, chills for 1 week. States she believes she had a fever, but did not check her temperature, states took some fever reducers, last dose a week ago, which she thinks helped. Has been taking over-the-counter cold and flu medications as well with minimal relief. She denies receiving a flu shot this year yet. Nothing making her symptoms better or worse. No one around her has been sick with the same. No cp, sob, abd, back pain, urinary symptoms. Denies pregnancy.   History reviewed. No pertinent past medical history.  There are no active problems to display for this patient.   Past Surgical History:  Procedure Laterality Date  . HERNIA REPAIR      OB History    Gravida Para Term Preterm AB Living   1 0           SAB TAB Ectopic Multiple Live Births                   Home Medications    Prior to Admission medications   Medication Sig Start Date End Date Taking? Authorizing Provider  amoxicillin-clavulanate (AUGMENTIN) 875-125 MG tablet Take 1 tablet by mouth every 12 (twelve) hours. 08/04/16   Gwyneth Sprout, MD  fluticasone (FLONASE) 50 MCG/ACT nasal spray Place 1 spray into both nostrils daily. 08/04/16   Gwyneth Sprout, MD  HYDROcodone-homatropine (HYCODAN) 5-1.5 MG/5ML syrup Take 5 mLs by mouth every 6 (six) hours as needed for cough. 08/04/16   Gwyneth Sprout, MD    Family History History reviewed. No pertinent family history.  Social History Social History  Substance Use Topics  . Smoking status: Never Smoker  . Smokeless tobacco: Never Used  . Alcohol use No     Allergies   Mango butter   Review of  Systems Review of Systems  Constitutional: Positive for chills and fever.  HENT: Positive for congestion, ear pain and sore throat. Negative for ear discharge and sinus pain.   Respiratory: Positive for cough. Negative for chest tightness and shortness of breath.   Cardiovascular: Negative for chest pain, palpitations and leg swelling.  Gastrointestinal: Negative for abdominal pain, diarrhea, nausea and vomiting.  Genitourinary: Negative for dysuria and flank pain.  Musculoskeletal: Negative for arthralgias, myalgias, neck pain and neck stiffness.  Skin: Negative for rash.  Neurological: Positive for headaches. Negative for dizziness and weakness.  All other systems reviewed and are negative.    Physical Exam Updated Vital Signs BP 121/76 (BP Location: Right Arm)   Pulse 89   Temp 97.9 F (36.6 C) (Oral)   Resp 18   Ht  (1.626 m)   Wt 77.1 kg (170 lb)   LMP 11/14/2016   SpO2 100%   BMI 29.18 kg/m   Physical Exam  Constitutional: She is oriented to person, place, and time. She appears well-developed and well-nourished. No distress.  HENT:  Head: Normocephalic.  Right Ear: External ear normal.  Left Ear: External ear normal.  Nose: Nose normal.  Mouth/Throat: No oropharyngeal exudate.  Oropharynx erythematous, no exudate, uvula midline. Ear canal and TMs normal bilaterally  Eyes: Conjunctivae are  normal.  Neck: Neck supple.  Cardiovascular: Normal rate, regular rhythm and normal heart sounds.   Pulmonary/Chest: Effort normal and breath sounds normal. No respiratory distress. She has no wheezes. She has no rales.  Abdominal: Soft. Bowel sounds are normal. She exhibits no distension. There is no tenderness. There is no rebound.  Musculoskeletal: She exhibits no edema.  Neurological: She is alert and oriented to person, place, and time.  Skin: Skin is warm and dry.  Psychiatric: She has a normal mood and affect. Her behavior is normal.  Nursing note and vitals  reviewed.    ED Treatments / Results  Labs (all labs ordered are listed, but only abnormal results are displayed) Labs Reviewed  RAPID STREP SCREEN (NOT AT [facility])  CULTURE, GROUP A STREP [facility])    EKG  EKG Interpretation None       Radiology No results found.  Procedures Procedures (including critical care time)  Medications Ordered in ED Medications - No data to display   Initial Impression / Assessment and Plan / ED Course  I have reviewed the triage vital signs and the nursing notes.  Pertinent labs & imaging results that were available during my care of the patient were reviewed by me and considered in my medical decision making (see chart for details).     Pt in ED with flu like symptoms. Afebrile here. Non toxic appearing. No meningismus. VS normal. Symptoms consistent with Viral URI, possibly flu. Stable for dc home with symptomatic tx. Rapid strep negative here. Plan to to prescribe sudafed and tessalon. Follow up with pcp. Return precautions discussed.   Vitals:   12/14/16 1308  BP: 121/76  Pulse: 89  Resp: 18  Temp: 97.9 F (36.6 C)  TempSrc: Oral  SpO2: 100%  Weight: 77.1 kg (170 lb)  Height:  (1.626 m)     Final Clinical Impressions(s) / ED Diagnoses   Final diagnoses:  Viral URI with cough    New Prescriptions New Prescriptions   BENZONATATE (TESSALON) 100 MG CAPSULE    Take 1 capsule (100 mg total) by mouth every 8 (eight) hours.   PSEUDOEPHEDRINE (SUDAFED) 30 MG TABLET    Take 1 tablet (30 mg total) by mouth every 4 (four) hours as needed for congestion.     Jaynie Crumble, PA-C 12/14/16 1534    Vanetta Mulders, MD 12/17/16 2329

## 2016-12-14 NOTE — ED Triage Notes (Signed)
Patient states that she is having a sore throat and bilateral ear pain x 1 week.

## 2016-12-17 LAB — CULTURE, GROUP A STREP (THRC)

## 2017-02-23 IMAGING — CR DG CHEST 2V
2 series · 2 of 2 positions shown · non-contrast
Comparison: None

CLINICAL DATA: Cough and congestion.

EXAM:
CHEST - 2 VIEW

[w chest pa]
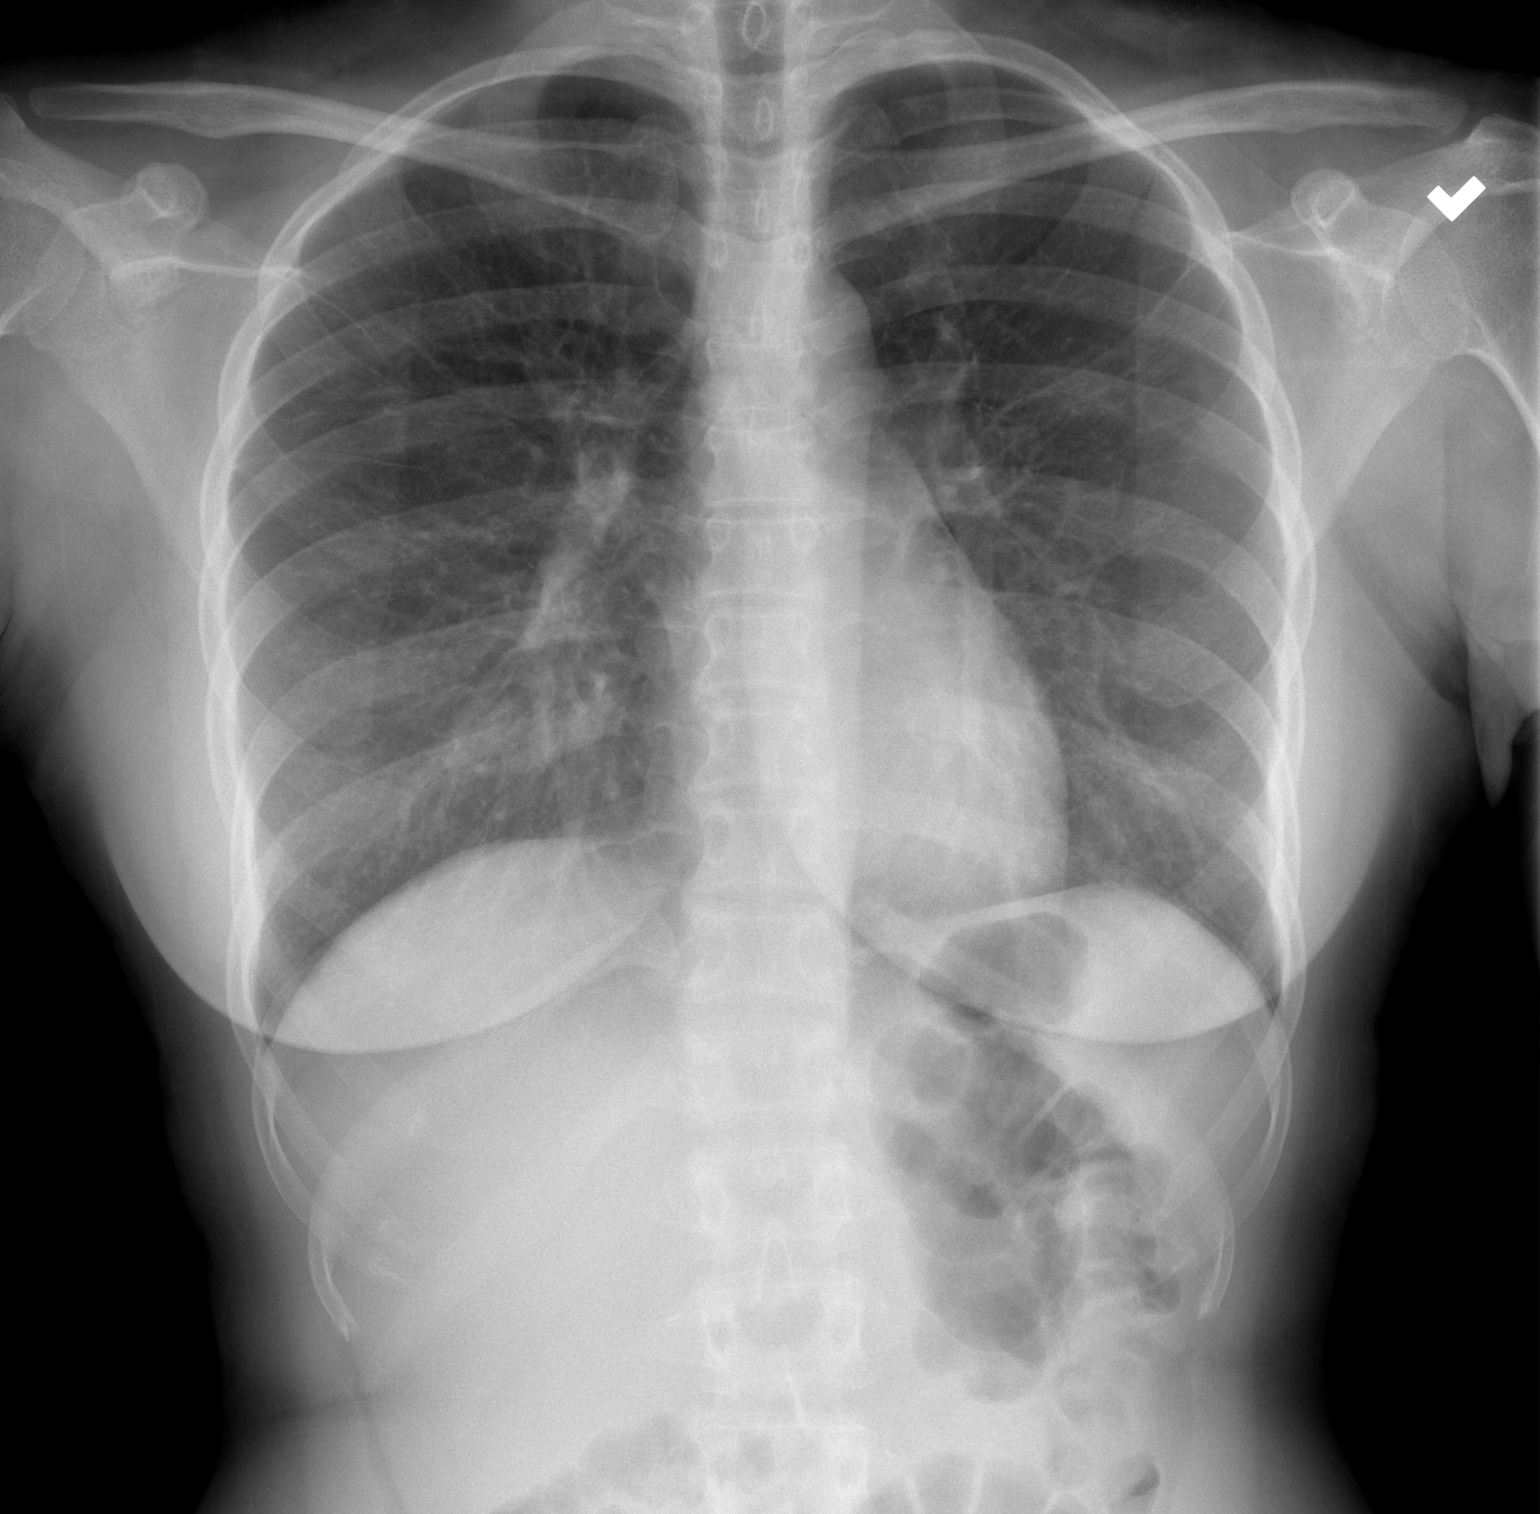

[w chest lat]
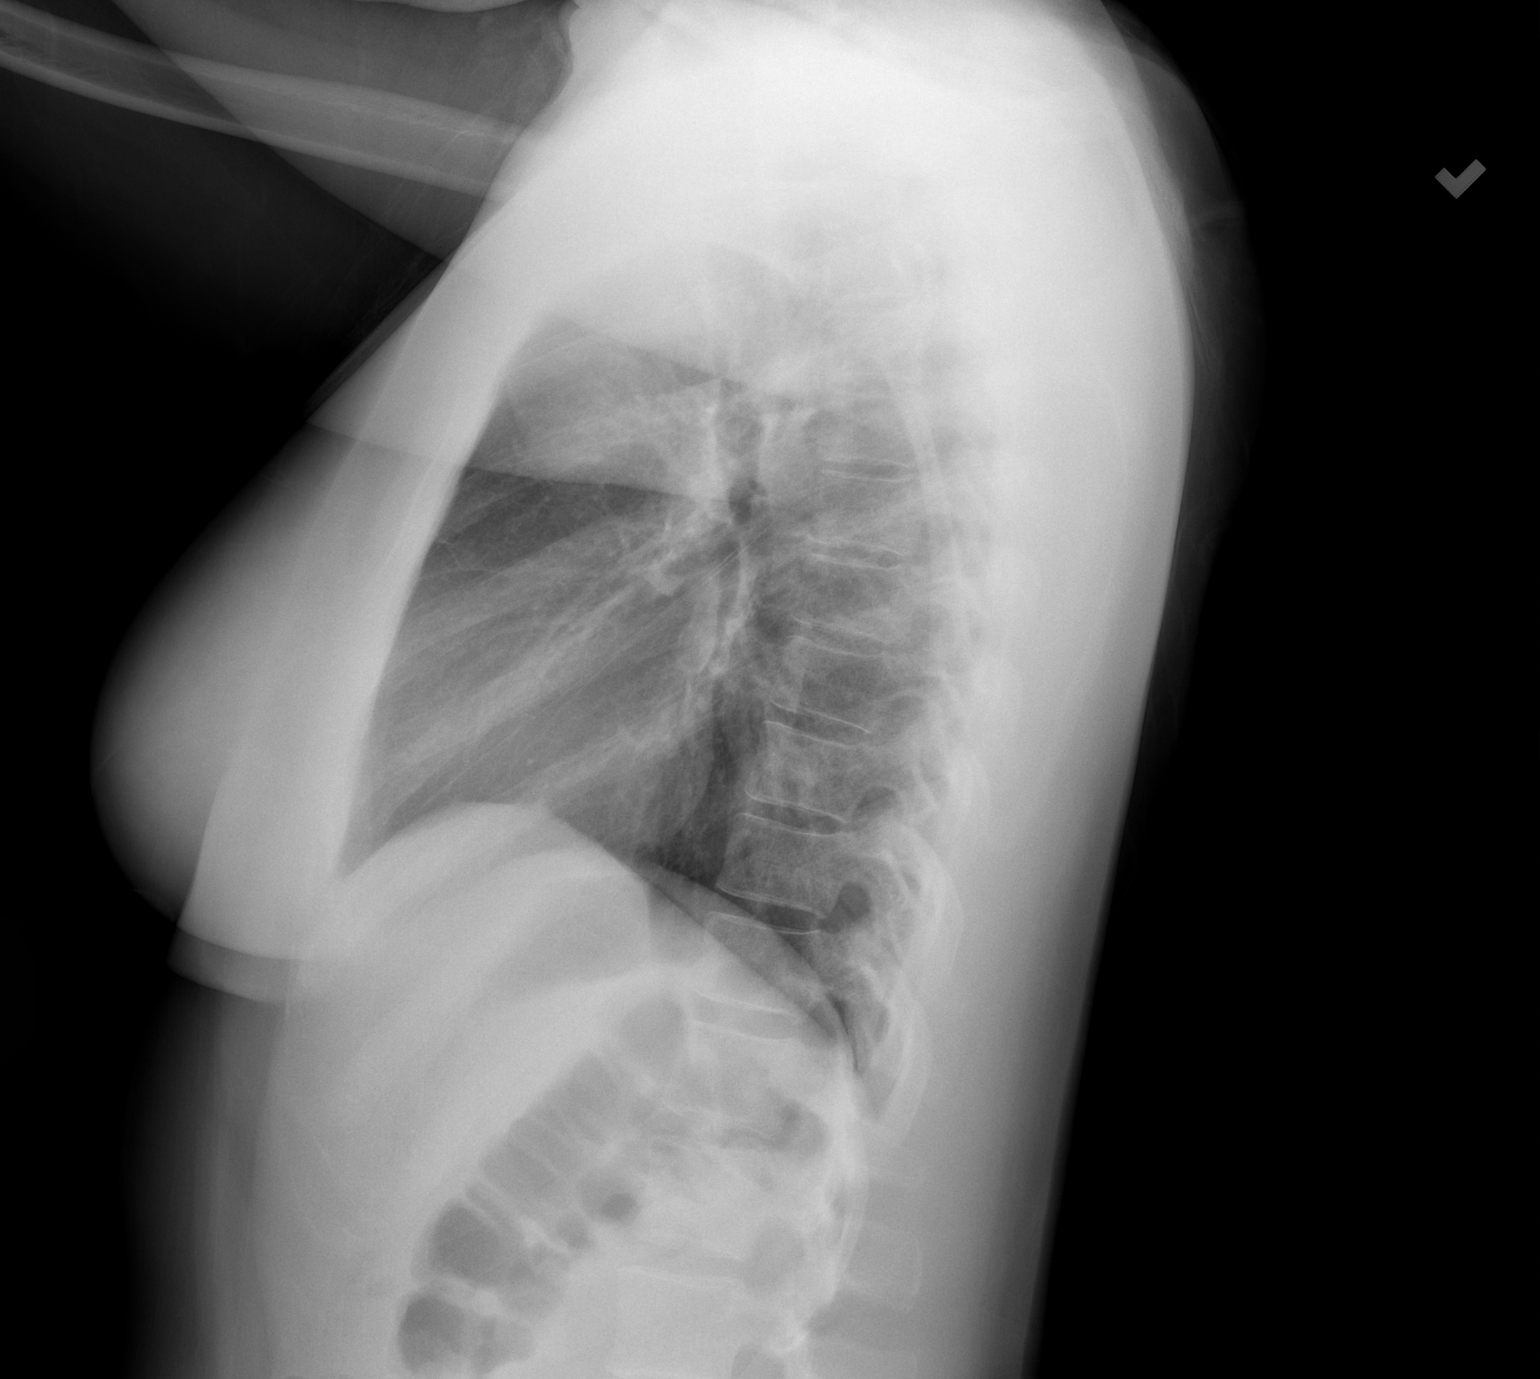

[2 of 2 positions shown; findings below may reference images not displayed]

FINDINGS: The heart size and mediastinal contours are within normal limits.
Mild bronchial thickening present in both lower lung zones. There is
no evidence of pulmonary edema, pneumothorax, nodule or pleural
fluid. The visualized skeletal structures are unremarkable.
IMPRESSION: Bronchial thickening which may be reflective of active bronchitis.
No focal infiltrate identified.
